# Patient Record
Sex: Female | Born: 2000 | Race: Black or African American | Hispanic: No | Marital: Single | Smoking: Never smoker
Health system: Southern US, Community
[De-identification: ages and names within clinical notes are randomized; demographics above are authoritative.]

## PROBLEM LIST (undated history)

## (undated) ENCOUNTER — Inpatient Hospital Stay (HOSPITAL_COMMUNITY): Payer: Self-pay

## (undated) DIAGNOSIS — H5200 Hypermetropia, unspecified eye: Secondary | ICD-10-CM

## (undated) HISTORY — DX: Hypermetropia, unspecified eye: H52.00

---
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
## 2018-06-29 IMAGING — US US MFM OB COMP +14 WKS
1 series · 14 of 28 positions shown · non-contrast
Comparison: none

[Series 1: us mfm ob comp +14 wks · 81 acquisitions, 14 frames shown]
[im 3/81]
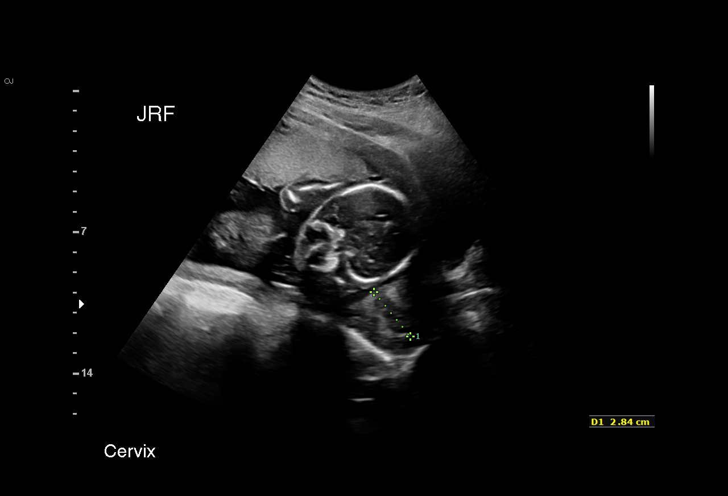
[im 9/81]
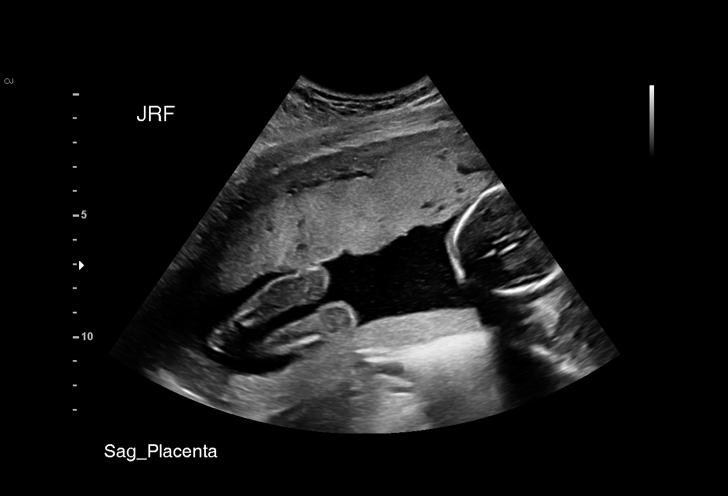
[im 15/81]
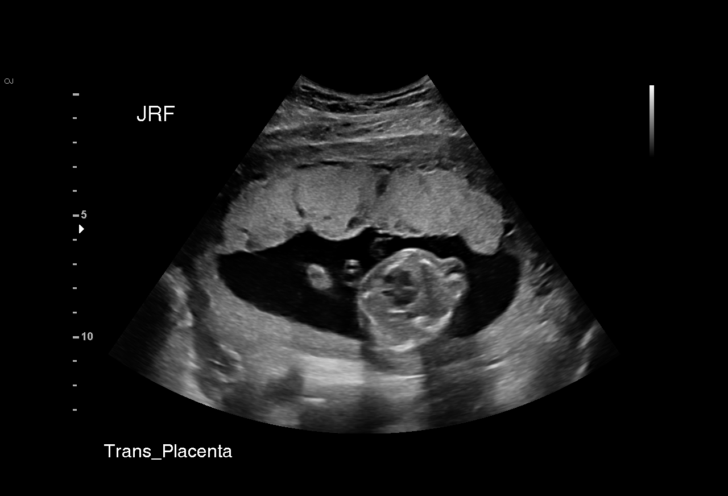
[im 21/81]
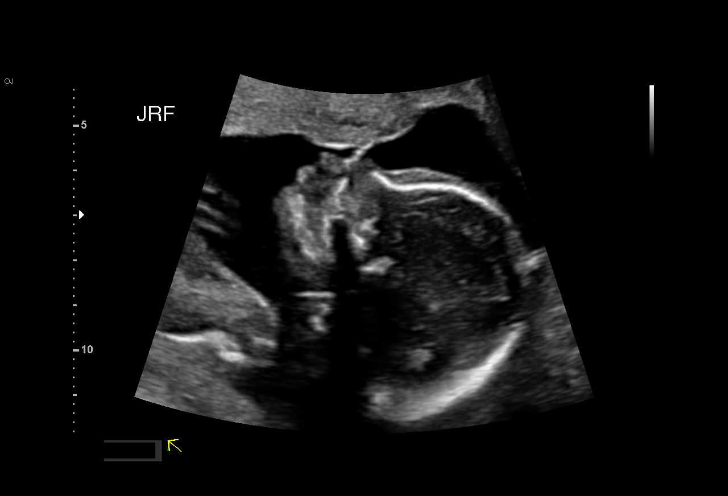
[im 27/81]
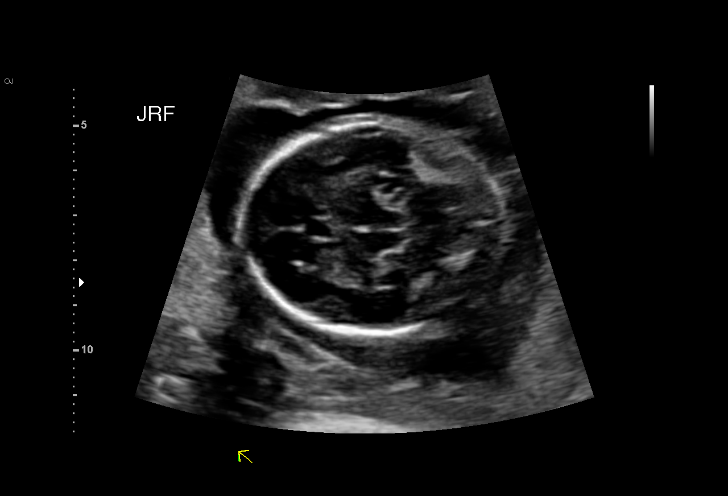
[im 33/81]
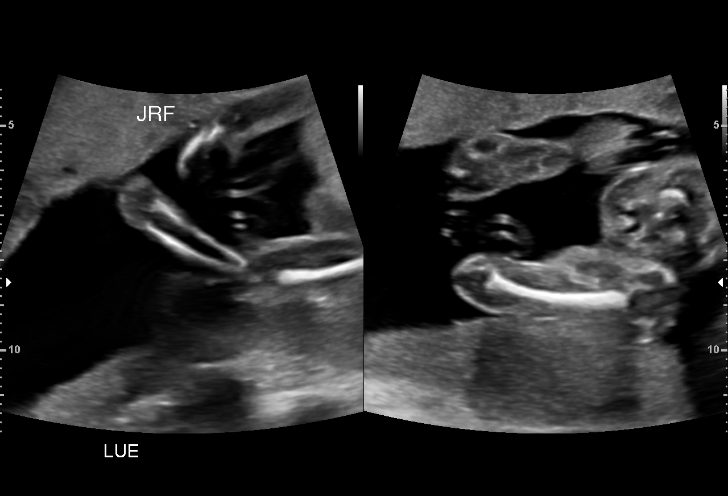
[im 39/81]
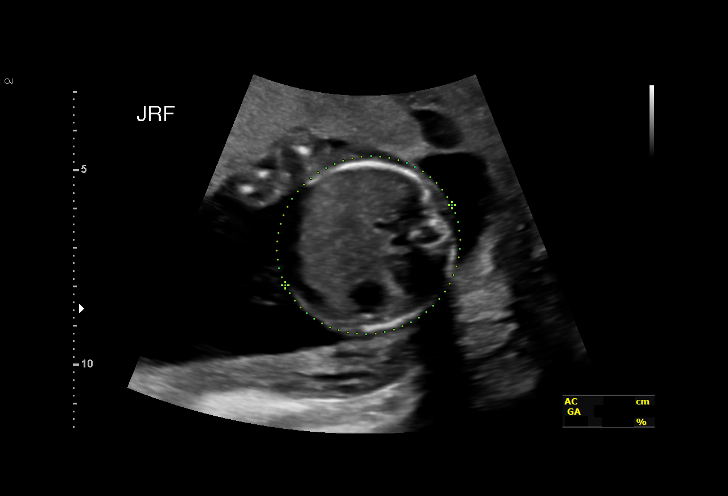
[im 45/81]
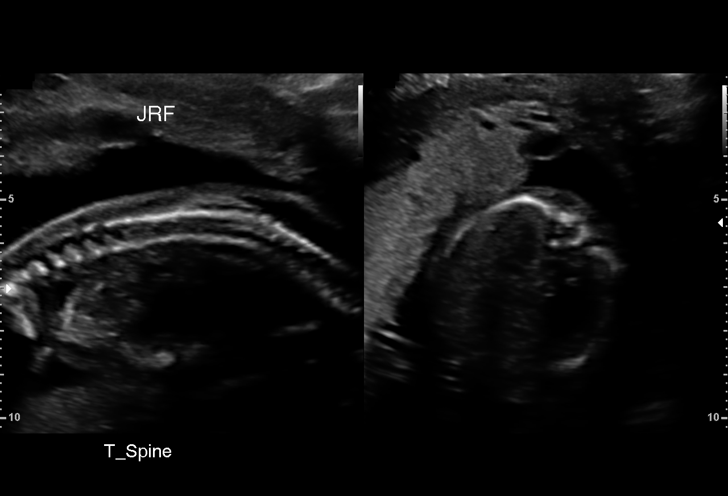
[im 51/81]
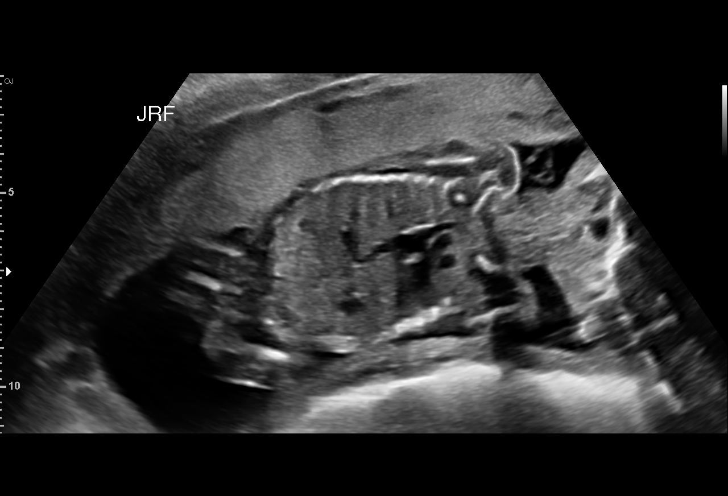
[im 57/81]
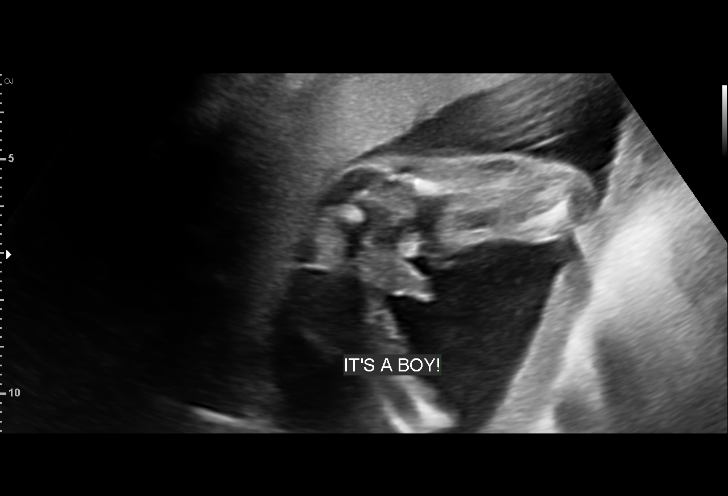
[im 63/81]
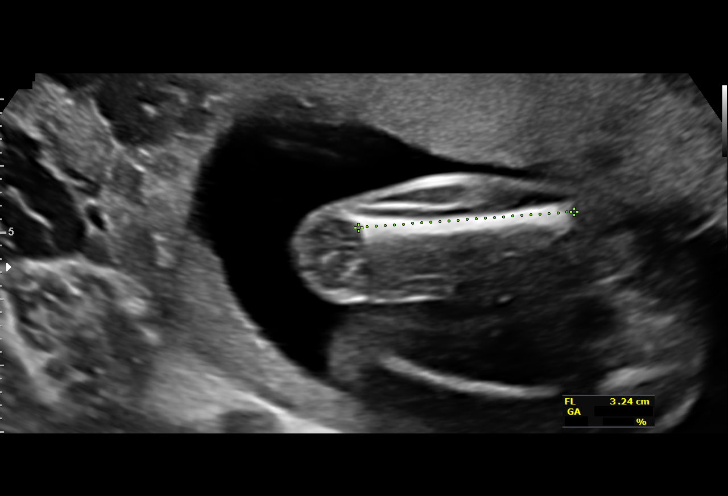
[im 69/81]
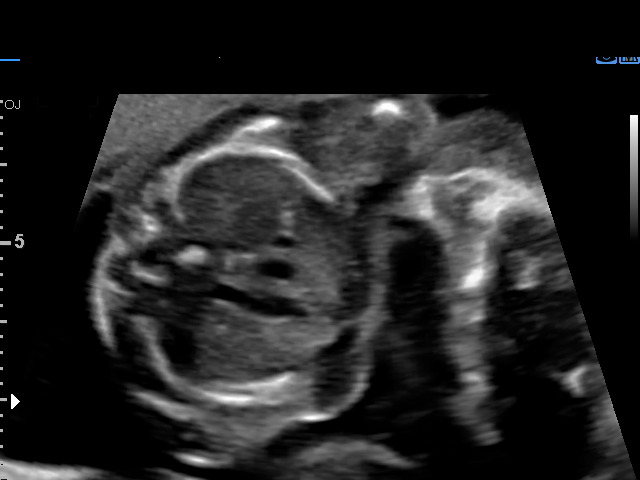
[im 75/81]
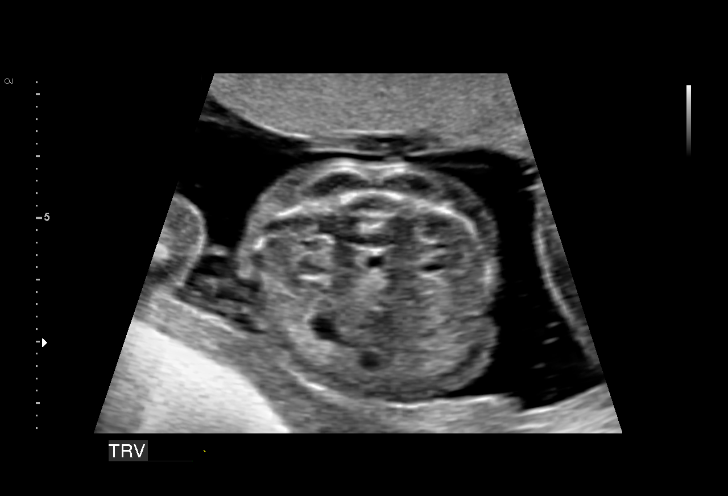
[im 81/81]
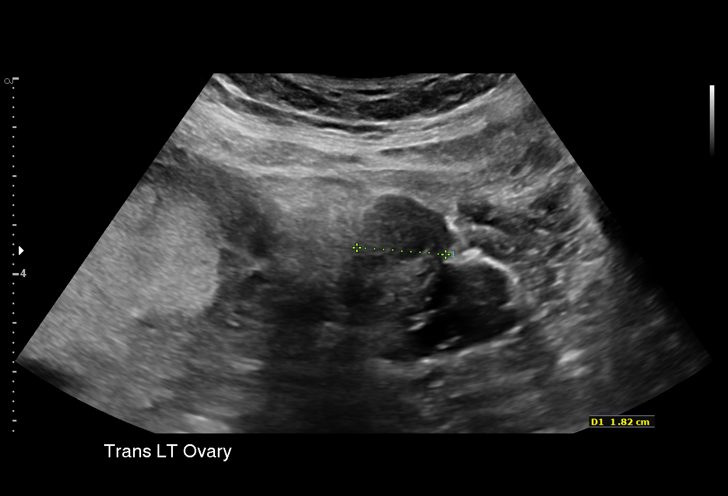

[14 of 28 positions shown; findings below may reference images not displayed]

Road [HOSPITAL]

Indications

19 weeks gestation of pregnancy
Encounter for antenatal screening for
malformations
OB History

Blood Type:            Height:  5'7"   Weight (lb):  166      BMI:   26
Gravidity:    1         Term:   0        Prem:   0        SAB:   0
TOP:          0       Ectopic:  0        Living: 0
Fetal Evaluation

Num Of Fetuses:     1
Fetal Heart         155
Rate(bpm):
Cardiac Activity:   Observed
Presentation:       Cephalic
Placenta:           Anterior, above cervical os
P. Cord Insertion:  Visualized

Amniotic Fluid
AFI FV:      Subjectively within normal limits

Largest Pocket(cm)
4.1
Biometry

BPD:      45.2  mm     G. Age:  19w 5d         61  %    CI:        73.59   %   70 - 86
FL/HC:      19.4   %   16.1 -
HC:      167.4  mm     G. Age:  19w 3d         42  %    HC/AC:      1.16       1.09 -
AC:      144.8  mm     G. Age:  19w 5d         57  %    FL/BPD:     71.7   %
FL:       32.4  mm     G. Age:  20w 1d         67  %    FL/AC:      22.4   %   20 - 24
HUM:      29.3  mm     G. Age:  19w 4d         55  %

Est. FW:     319  gm    0 lb 11 oz      54  %
Gestational Age

LMP:           22w 1d       Date:   11/17/15                 EDD:   08/23/16
U/S Today:     19w 5d                                        EDD:   09/09/16
Best:          19w 3d    Det. By:   Early Ultrasound         EDD:   09/11/16
(01/18/16)
Anatomy

Cranium:               Appears normal         Aortic Arch:            Appears normal
Cavum:                 Appears normal         Ductal Arch:            Not well visualized
Ventricles:            Appears normal         Diaphragm:              Appears normal
Choroid Plexus:        Appears normal         Stomach:                Appears normal, left
sided
Cerebellum:            Appears normal         Abdomen:                Appears normal
Posterior Fossa:       Appears normal         Abdominal Wall:         Appears nml (cord
insert, abd wall)
Nuchal Fold:           Appears normal         Cord Vessels:           Appears normal (3
vessel cord)
Face:                  Micrognathia?profi     Kidneys:                Appear normal
le not well vis
Lips:                  Appears normal         Bladder:                Appears normal
Thoracic:              Appears normal         Spine:                  Appears normal
Heart:                 Not well visualized    Upper Extremities:      Appears normal
RVOT:                  Not well visualized    Lower Extremities:      Appears normal
LVOT:                  Not well visualized

Other:  Male gender. Heels and left  5th digit visualized.
Cervix Uterus Adnexa

Cervix
Length:              3  cm.
Normal appearance by transabdominal scan.

Uterus
No abnormality visualized.

Left Ovary
Not visualized. No adnexal mass visualized.

Right Ovary
Not visualized. No adnexal mass visualized.
Impression

Singleton intrauterine pregnancy at 19+3 weeks, here for
anatomic survey
Review of the anatomy shows no sonographic markers for
aneuploidy or structural anomalies
However, The profile was suggestive of micrognathia. These
views were very suboptimal. Cardiac evaluation was also
suboptimal secondary to early EGA and fetal position. no
other structural anomalies were seen
Amniotic fluid volume is normal
Estimated fetal weight is 319g which is growth in the 54th
percentile
Recommendations

Due to the possibility of micrognathia on today's scan i
strongly recommend a repeat scan in 4-6 weeks to evaluate
this and the cardiac anatomy

## 2018-08-04 IMAGING — US US MFM OB FOLLOW-UP
1 series · 14 of 28 positions shown · non-contrast
Comparison: none

[Series 1: us mfm ob follow-up · 14 of 96 slices shown]
[im 4/96]
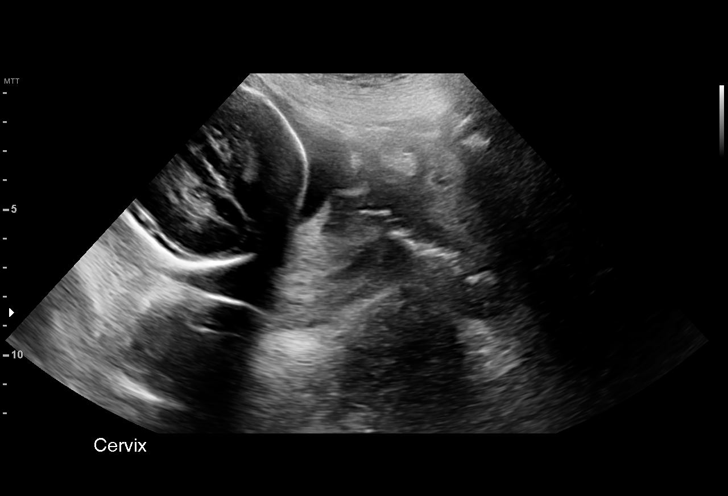
[im 11/96]
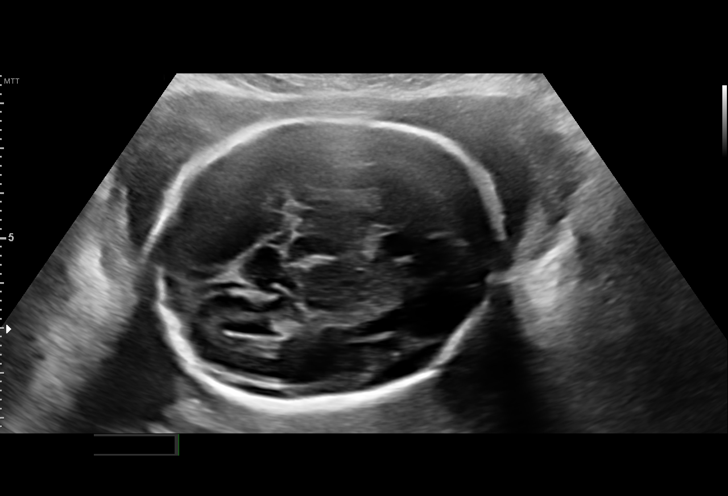
[im 18/96]
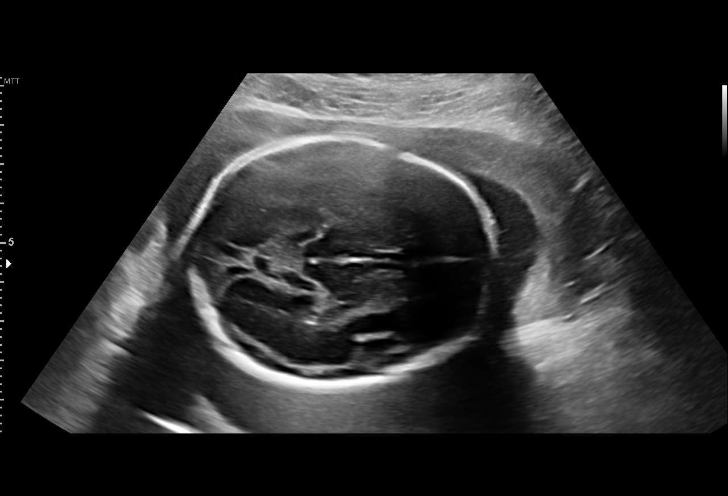
[im 25/96]
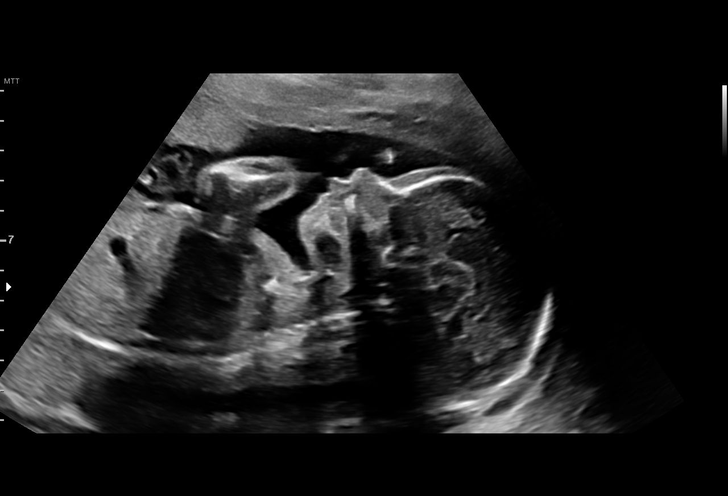
[im 32/96]
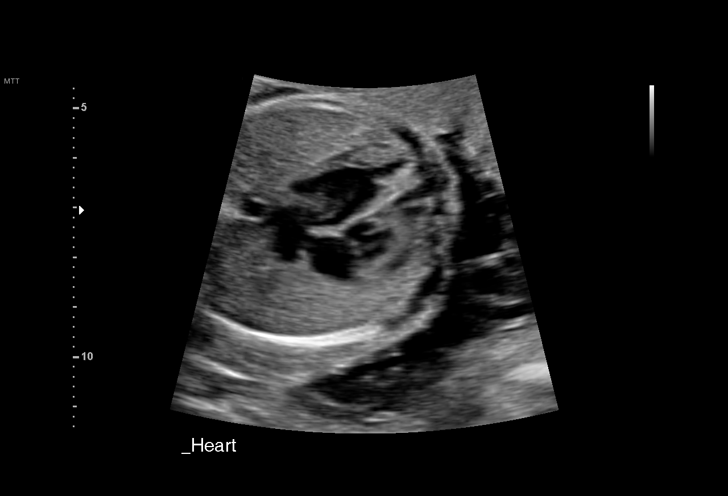
[im 39/96]
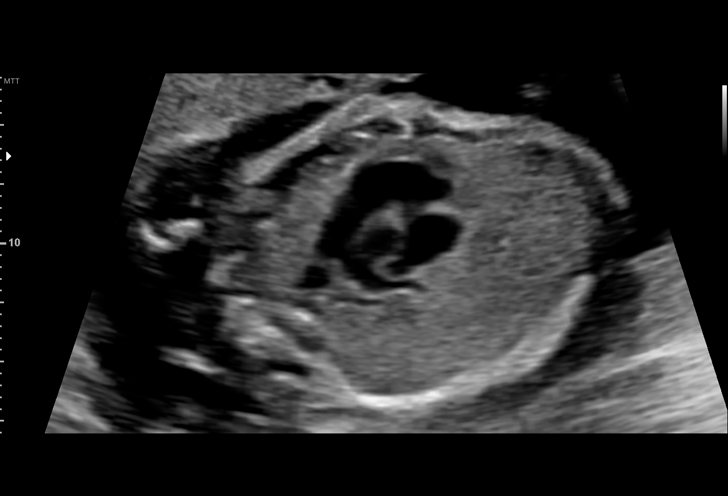
[im 46/96]
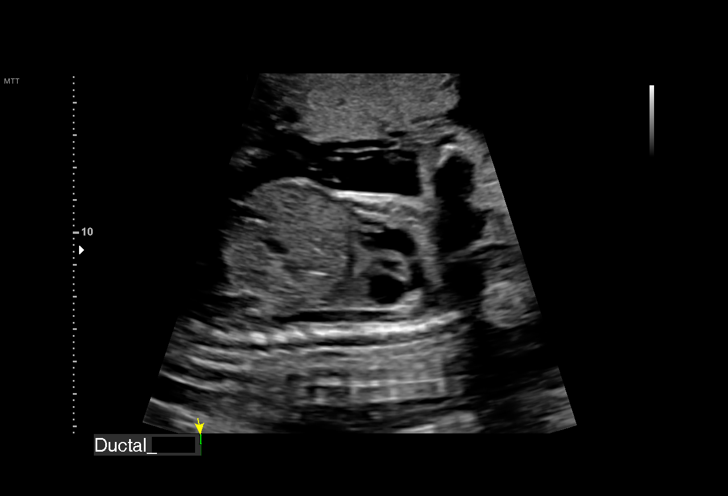
[im 53/96]
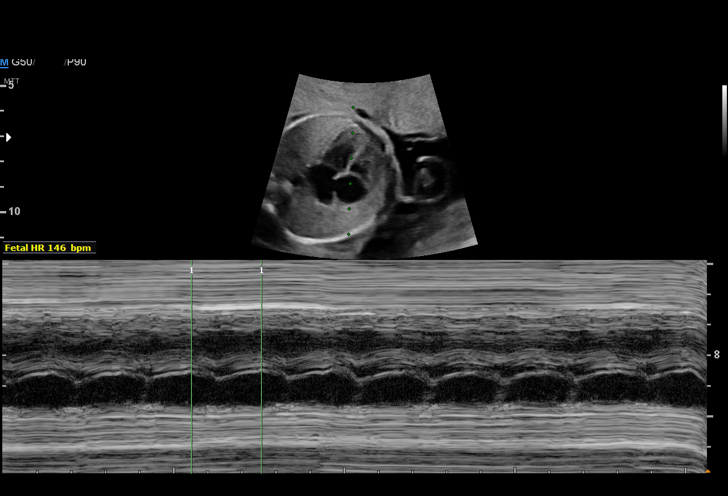
[im 60/96]
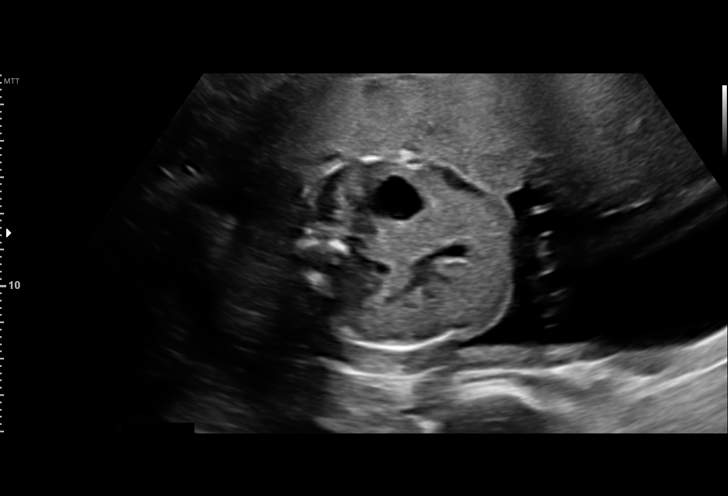
[im 67/96]
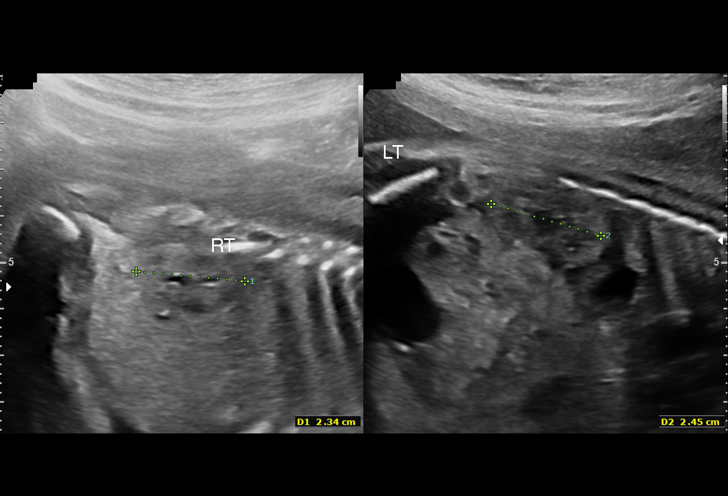
[im 74/96]
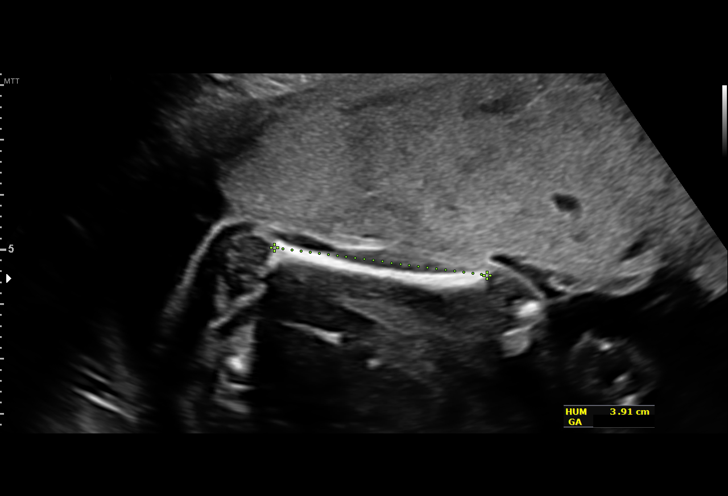
[im 81/96]
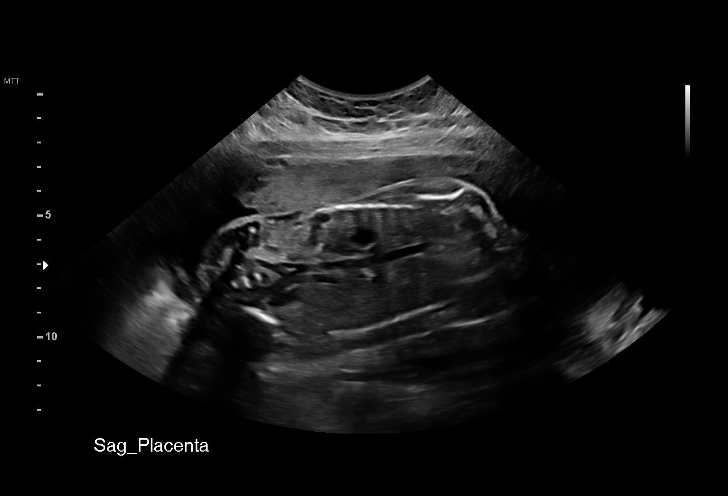
[im 88/96]
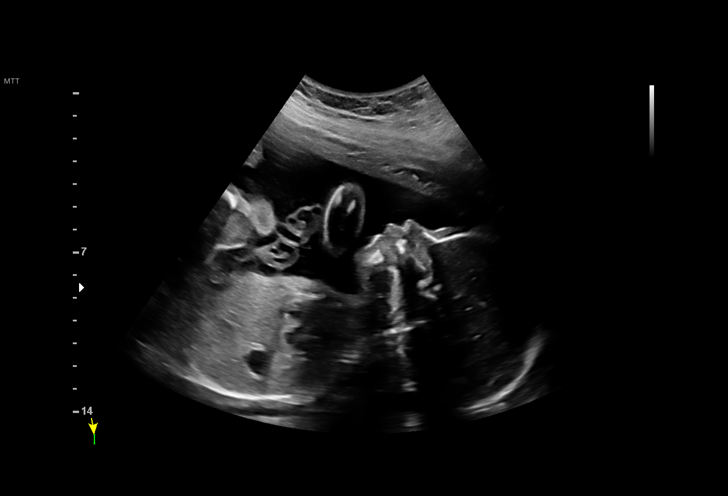
[im 96/96]
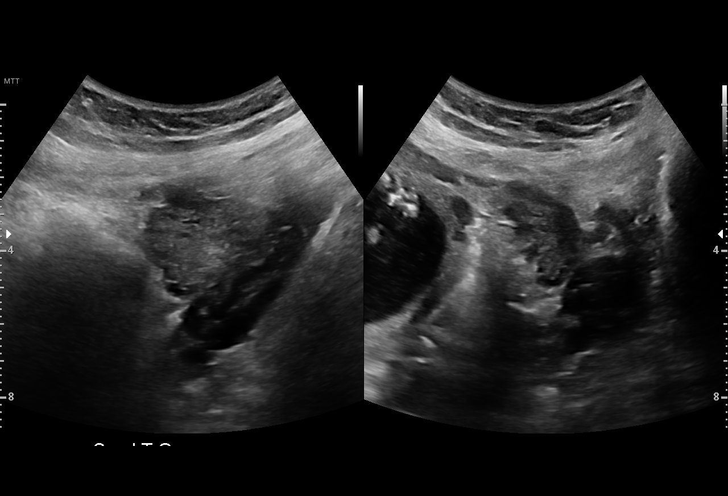

[14 of 28 positions shown; findings below may reference images not displayed]

([HOSPITAL])
[REDACTED] [HOSPITAL]

Indications

24 weeks gestation of pregnancy
Encounter for other antenatal screening
follow-up
Obesity complicating pregnancy, second
trimester
OB History

Blood Type:            Height:  5'2"   Weight (lb):  191      BMI:
Gravidity:    1         Term:   0        Prem:   0        SAB:   0
TOP:          0       Ectopic:  0        Living: 0
Fetal Evaluation

Num Of Fetuses:     1
Fetal Heart         146
Rate(bpm):
Cardiac Activity:   Observed
Presentation:       Cephalic
Placenta:           Anterior, above cervical os
P. Cord Insertion:  Previously Visualized
Amniotic Fluid
AFI FV:      Subjectively within normal limits

Largest Pocket(cm)
4.5
Biometry

BPD:      62.2  mm     G. Age:  25w 2d         67  %    CI:        76.15   %   70 - 86
FL/HC:      20.0   %   18.7 -
HC:      225.9  mm     G. Age:  24w 4d         32  %    HC/AC:      1.14       1.04 -
AC:      198.3  mm     G. Age:  24w 4d         39  %    FL/BPD:     72.5   %   71 - 87
FL:       45.1  mm     G. Age:  25w 0d         48  %    FL/AC:      22.7   %   20 - 24
HUM:      38.9  mm     G. Age:  23w 6d         25  %
CER:      27.7  mm     G. Age:  24w 6d         57  %

Est. FW:     724  gm    1 lb 10 oz      55  %
Gestational Age

LMP:           27w 2d       Date:   11/17/15                 EDD:   08/23/16
U/S Today:     24w 6d                                        EDD:   09/09/16
Best:          24w 4d    Det. By:   Early Ultrasound         EDD:   09/11/16
(01/18/16)
Anatomy

Cranium:               Appears normal         Aortic Arch:            Appears normal
Cavum:                 Appears normal         Ductal Arch:            Appears normal
Ventricles:            Appears normal         Diaphragm:              Appears normal
Choroid Plexus:        Appears normal         Stomach:                Appears normal, left
sided
Cerebellum:            Appears normal         Abdomen:                Previously seen
Posterior Fossa:       Previously seen        Abdominal Wall:         Appears nml (cord
insert, abd wall)
Nuchal Fold:           Previously seen        Cord Vessels:           Appears normal (3
vessel cord)
Face:                  Appears normal         Kidneys:                Appear normal
(orbits and Maricarmen
Lips:                  Previously seen        Bladder:                Appears normal
Thoracic:              Previously seen        Spine:                  Previously seen
Heart:                 Appears normal         Upper Extremities:      Previously seen
(4CH, axis, and situs
RVOT:                  Appears normal         Lower Extremities:      Previously seen
LVOT:                  Appears normal

Other:  Male gender previously seen. Heels and Left 5th digit previously seen.
Cervix Uterus Adnexa

Cervix
Length:            3.4  cm.
Normal appearance by transabdominal scan.

Uterus
No abnormality visualized.

Left Ovary
Size(cm)     2.05  x    1.71   x  2.42      Vol(ml):
Within normal limits. No adnexal mass visualized.
Right Ovary
Size(cm)     2.76  x    1.86   x  1.62      Vol(ml):
Within normal limits. No adnexal mass visualized.

Cul De Sac:   No free fluid seen.

Adnexa:       No abnormality visualized.
Impression

Single IUP at 24w 4d
Normal interval anatomy
A normal profile was noted - no evidence of micrognathia was
appreciated
Fetal growth is appropriate (55th %tile)
Anterior placenta without previa
Normal amniotic fluid volume
Recommendations

Follow-up ultrasounds as clinically indicated.
